# Patient Record
Sex: Male | Born: 1980 | Race: White | Hispanic: No | Smoking: Current every day smoker
Health system: Southern US, Community
[De-identification: ages and names within clinical notes are randomized; demographics above are authoritative.]

## PROBLEM LIST (undated history)

## (undated) DIAGNOSIS — M109 Gout, unspecified: Secondary | ICD-10-CM

## (undated) DIAGNOSIS — I10 Essential (primary) hypertension: Secondary | ICD-10-CM

## (undated) HISTORY — PX: HERNIA REPAIR: SHX51

---

## 2004-05-08 ENCOUNTER — Ambulatory Visit (HOSPITAL_COMMUNITY): Admission: RE | Admit: 2004-05-08 | Discharge: 2004-05-08 | Payer: Self-pay | Admitting: Family Medicine

## 2006-07-20 ENCOUNTER — Emergency Department (HOSPITAL_COMMUNITY): Admission: EM | Admit: 2006-07-20 | Discharge: 2006-07-20 | Payer: Self-pay | Admitting: Emergency Medicine

## 2006-08-22 ENCOUNTER — Ambulatory Visit: Payer: Self-pay | Admitting: Orthopedic Surgery

## 2006-08-23 ENCOUNTER — Ambulatory Visit: Payer: Self-pay | Admitting: Family Medicine

## 2006-08-23 LAB — CONVERTED CEMR LAB
AST: 25 units/L (ref 0–37)
Albumin: 5 g/dL (ref 3.5–5.2)
Alkaline Phosphatase: 62 units/L (ref 39–117)
BUN: 12 mg/dL (ref 6–23)
Basophils Relative: 0 % (ref 0–1)
Creatinine, Ser: 1.06 mg/dL (ref 0.40–1.50)
Eosinophils Relative: 1 % (ref 0–5)
Glucose, Bld: 88 mg/dL (ref 70–99)
HCT: 47.1 % (ref 39.0–52.0)
Lymphocytes Relative: 39 % (ref 12–46)
Lymphs Abs: 3 10*3/uL (ref 0.7–3.3)
Monocytes Absolute: 0.6 10*3/uL (ref 0.2–0.7)
Monocytes Relative: 8 % (ref 3–11)
Neutrophils Relative %: 51 % (ref 43–77)
Platelets: 263 10*3/uL (ref 150–400)
Potassium: 4.3 meq/L (ref 3.5–5.3)
WBC: 7.6 10*3/uL (ref 4.0–10.5)

## 2006-08-26 ENCOUNTER — Encounter: Payer: Self-pay | Admitting: Family Medicine

## 2006-08-26 DIAGNOSIS — F411 Generalized anxiety disorder: Secondary | ICD-10-CM | POA: Insufficient documentation

## 2006-08-26 DIAGNOSIS — L259 Unspecified contact dermatitis, unspecified cause: Secondary | ICD-10-CM | POA: Insufficient documentation

## 2006-08-26 DIAGNOSIS — F17200 Nicotine dependence, unspecified, uncomplicated: Secondary | ICD-10-CM

## 2006-08-26 DIAGNOSIS — F329 Major depressive disorder, single episode, unspecified: Secondary | ICD-10-CM

## 2006-09-05 ENCOUNTER — Ambulatory Visit: Payer: Self-pay | Admitting: Orthopedic Surgery

## 2006-09-07 ENCOUNTER — Ambulatory Visit: Payer: Self-pay | Admitting: Family Medicine

## 2006-10-06 ENCOUNTER — Ambulatory Visit: Payer: Self-pay | Admitting: Family Medicine

## 2006-10-06 DIAGNOSIS — S6000XA Contusion of unspecified finger without damage to nail, initial encounter: Secondary | ICD-10-CM

## 2006-10-06 DIAGNOSIS — M79609 Pain in unspecified limb: Secondary | ICD-10-CM

## 2006-10-10 ENCOUNTER — Encounter (INDEPENDENT_AMBULATORY_CARE_PROVIDER_SITE_OTHER): Payer: Self-pay | Admitting: Family Medicine

## 2006-10-20 ENCOUNTER — Encounter: Payer: Self-pay | Admitting: Orthopedic Surgery

## 2006-10-26 ENCOUNTER — Encounter (INDEPENDENT_AMBULATORY_CARE_PROVIDER_SITE_OTHER): Payer: Self-pay | Admitting: Family Medicine

## 2006-11-07 ENCOUNTER — Ambulatory Visit: Payer: Self-pay | Admitting: Orthopedic Surgery

## 2006-11-11 ENCOUNTER — Encounter (INDEPENDENT_AMBULATORY_CARE_PROVIDER_SITE_OTHER): Payer: Self-pay | Admitting: Family Medicine

## 2006-11-17 ENCOUNTER — Ambulatory Visit: Payer: Self-pay | Admitting: Family Medicine

## 2006-11-17 DIAGNOSIS — M542 Cervicalgia: Secondary | ICD-10-CM | POA: Insufficient documentation

## 2006-11-18 ENCOUNTER — Telehealth (INDEPENDENT_AMBULATORY_CARE_PROVIDER_SITE_OTHER): Payer: Self-pay | Admitting: Family Medicine

## 2006-11-30 ENCOUNTER — Ambulatory Visit (HOSPITAL_COMMUNITY): Admission: RE | Admit: 2006-11-30 | Discharge: 2006-11-30 | Payer: Self-pay | Admitting: Family Medicine

## 2006-12-05 ENCOUNTER — Telehealth (INDEPENDENT_AMBULATORY_CARE_PROVIDER_SITE_OTHER): Payer: Self-pay | Admitting: Family Medicine

## 2007-01-03 ENCOUNTER — Telehealth (INDEPENDENT_AMBULATORY_CARE_PROVIDER_SITE_OTHER): Payer: Self-pay | Admitting: Family Medicine

## 2007-01-05 ENCOUNTER — Encounter (INDEPENDENT_AMBULATORY_CARE_PROVIDER_SITE_OTHER): Payer: Self-pay | Admitting: Family Medicine

## 2007-01-17 ENCOUNTER — Telehealth (INDEPENDENT_AMBULATORY_CARE_PROVIDER_SITE_OTHER): Payer: Self-pay | Admitting: Family Medicine

## 2007-01-17 ENCOUNTER — Ambulatory Visit: Payer: Self-pay | Admitting: Family Medicine

## 2007-01-17 DIAGNOSIS — K5289 Other specified noninfective gastroenteritis and colitis: Secondary | ICD-10-CM

## 2008-08-18 IMAGING — CR DG FOOT COMPLETE 3+V*L*
3 series · 3 of 3 positions shown · non-contrast
Comparison: none

HISTORY: Left foot pain

LEFT FOOT 3 VIEWS:
No fracture, dislocation, or bone destruction.
Joint spaces preserved.  
Mineralization normal.

[view not recorded (1 of 3)]
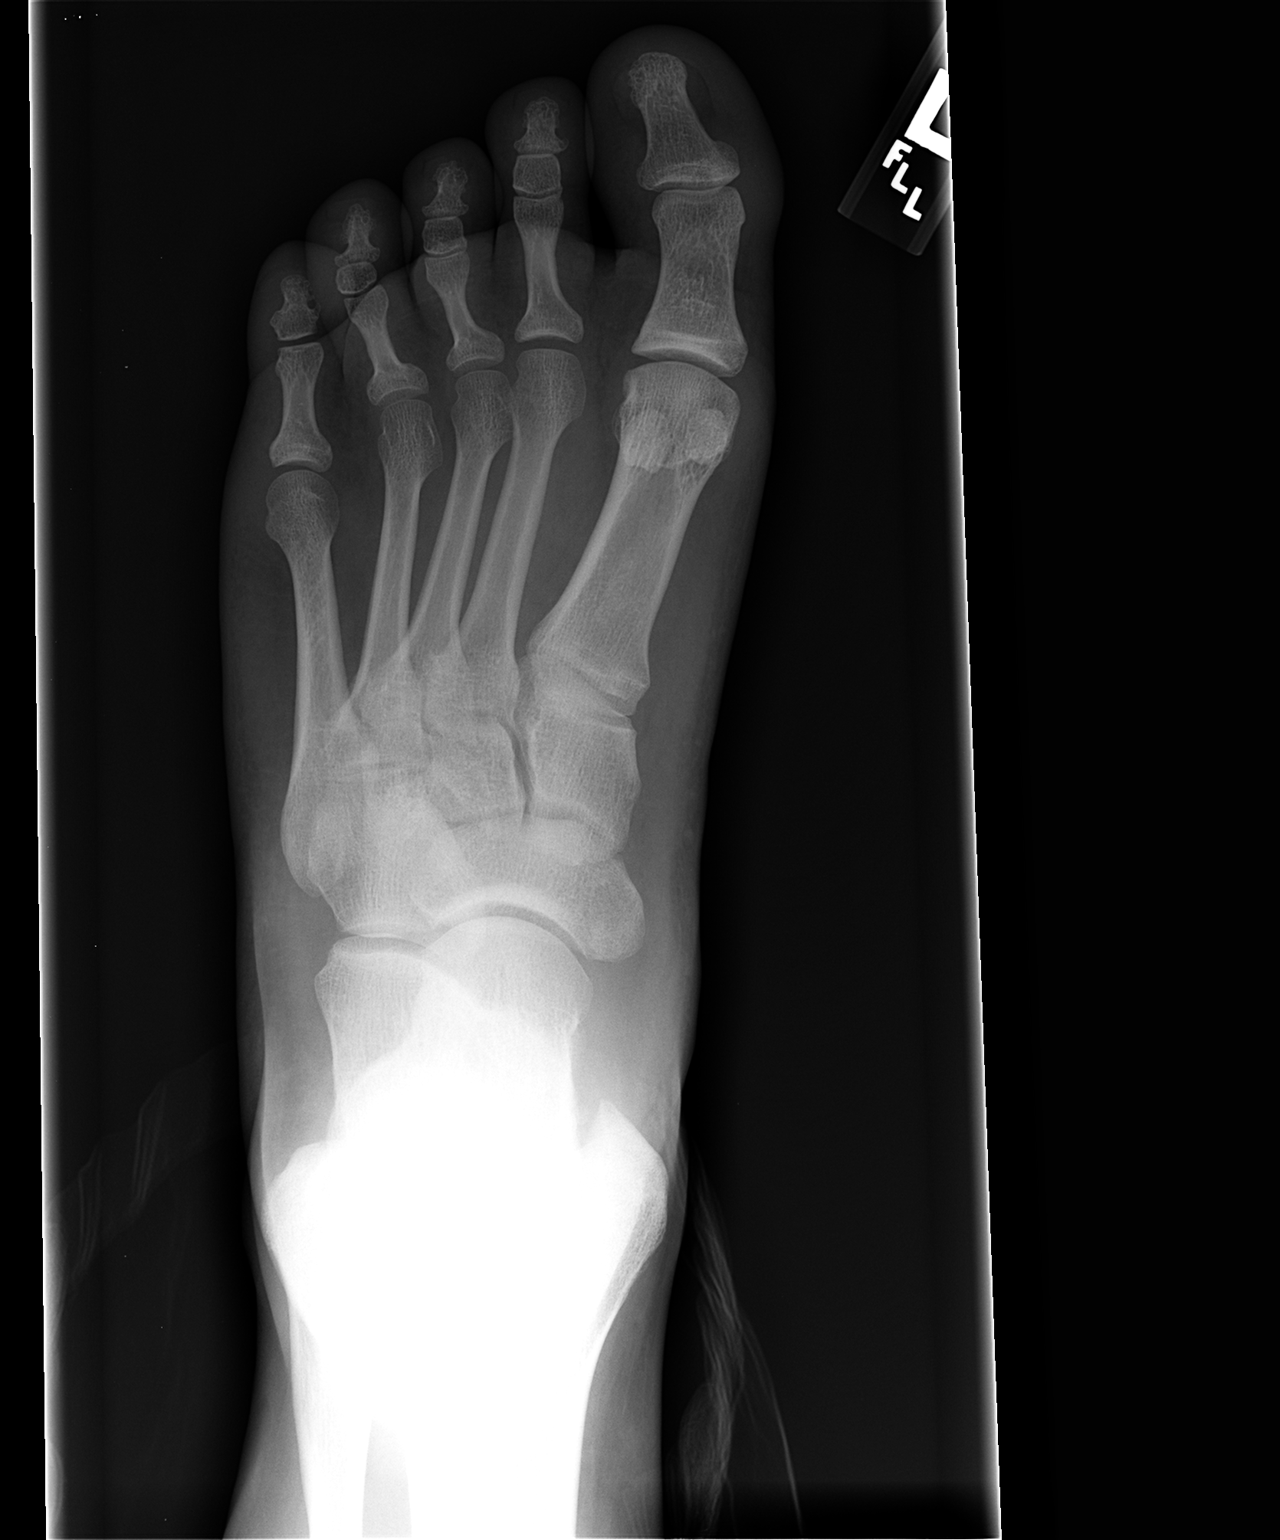

[view not recorded (2 of 3)]
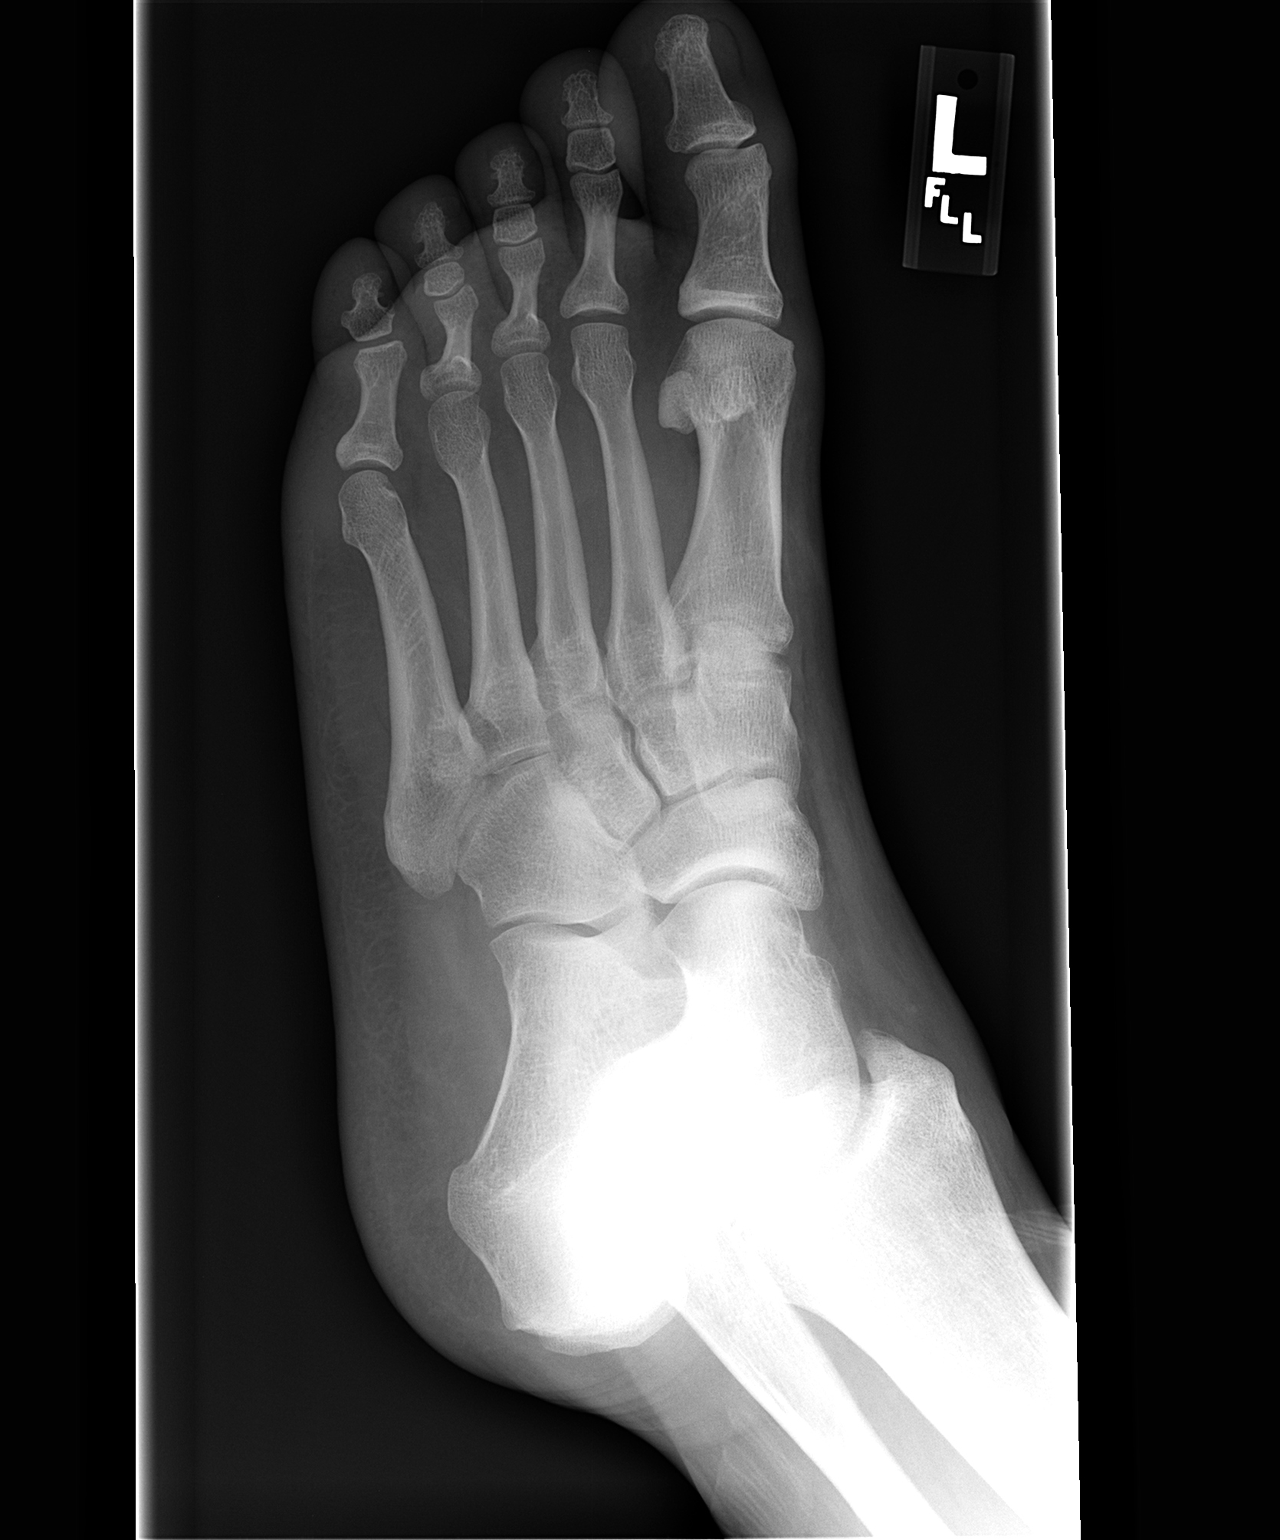

[view not recorded (3 of 3)]
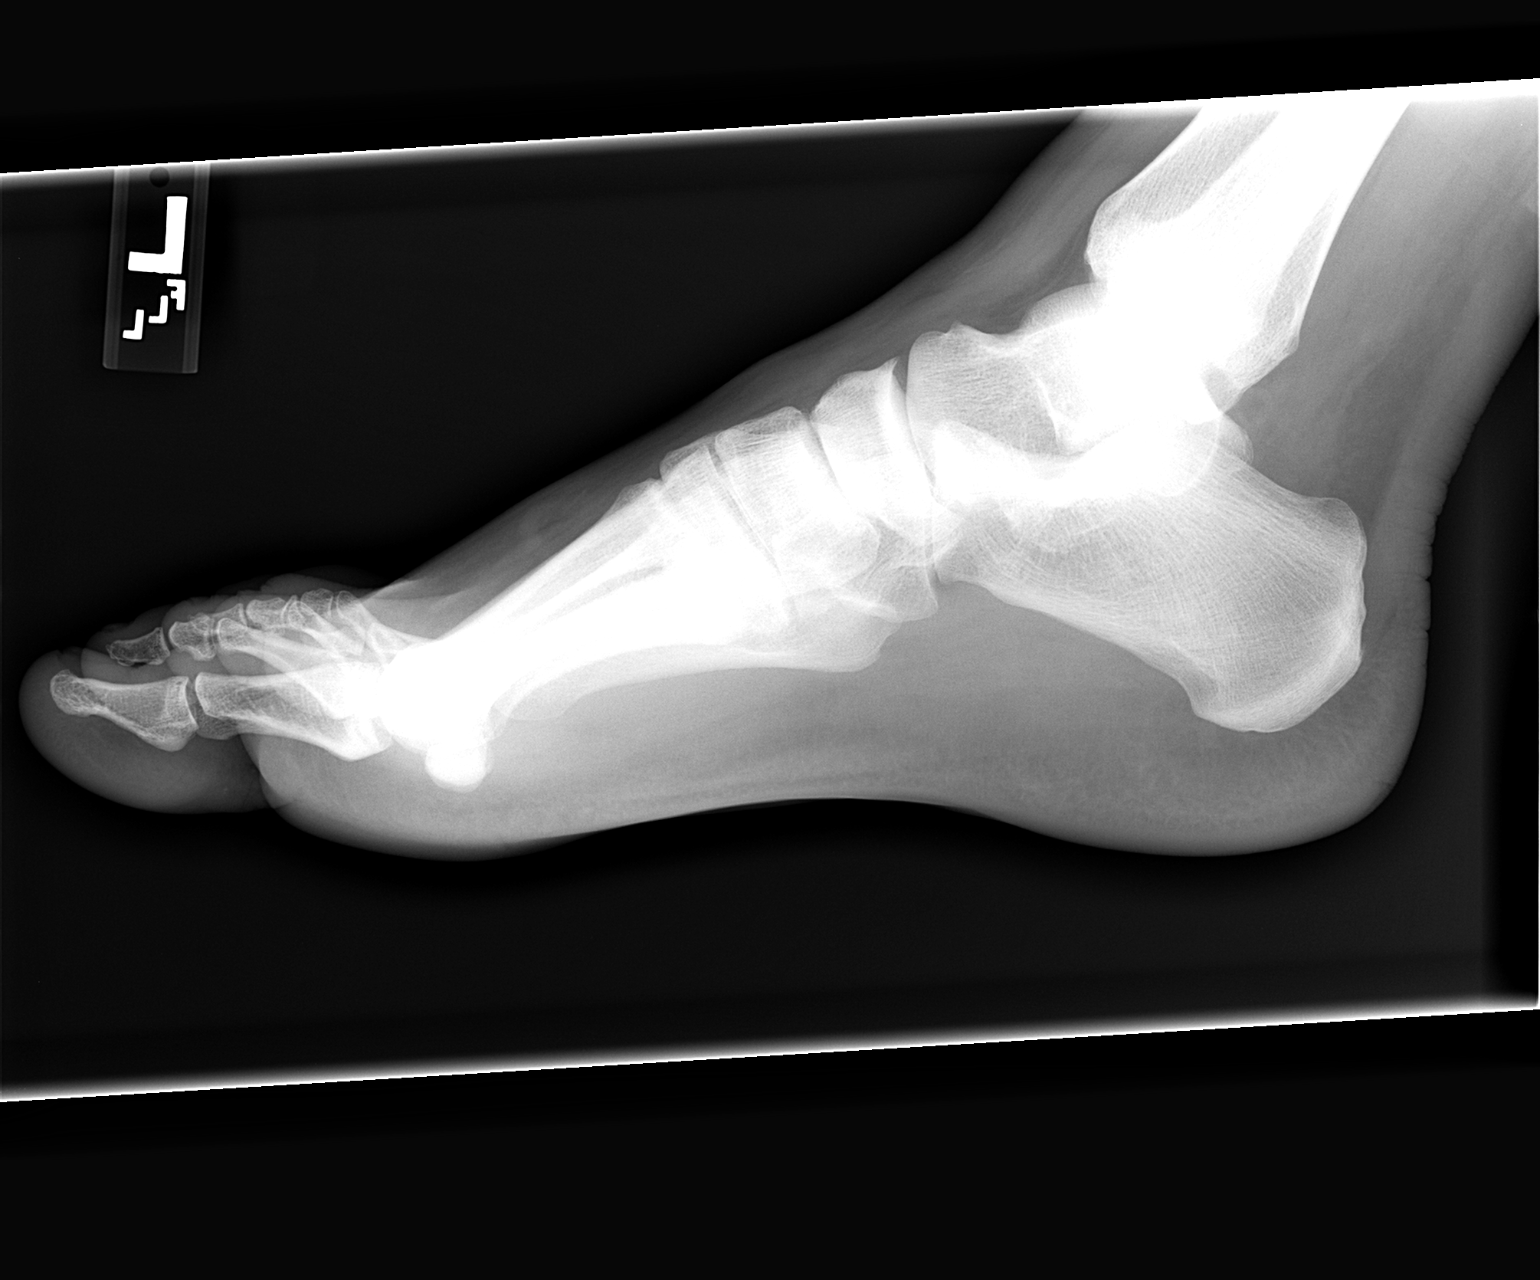

[3 of 3 positions shown; findings below may reference images not displayed]

IMPRESSION: No acute abnormalities.

## 2009-11-13 ENCOUNTER — Encounter: Payer: Self-pay | Admitting: Orthopedic Surgery

## 2010-09-08 NOTE — Letter (Signed)
Summary: *Orthopedic No Show Letter  [facility]  [address]  [place], [place] [address]   Phone: [phone]  Fax: [phone]      11/13/2009    Chase Torres [address] [place], [place]  [address]       Dear [name],   Our records indicate that you missed your scheduled appointment with Dr. Beaulah Corin on 11/10/09.  Please contact this office to reschedule your appointment as soon as possible.  It is important that you keep your scheduled appointments with your physician, so we can provide you the best care possible.       Sincerely,    Dr. Terrance Mass, MD [facility] Phone [phone]

## 2014-05-16 ENCOUNTER — Emergency Department (HOSPITAL_COMMUNITY)
Admission: EM | Admit: 2014-05-16 | Discharge: 2014-05-16 | Disposition: A | Discharge status: Home / Self Care | Payer: 59 | Attending: Emergency Medicine | Admitting: Emergency Medicine

## 2014-05-16 ENCOUNTER — Emergency Department (HOSPITAL_COMMUNITY): Payer: 59

## 2014-05-16 ENCOUNTER — Encounter (HOSPITAL_COMMUNITY): Payer: Self-pay | Admitting: Emergency Medicine

## 2014-05-16 DIAGNOSIS — N200 Calculus of kidney: Secondary | ICD-10-CM | POA: Insufficient documentation

## 2014-05-16 DIAGNOSIS — M549 Dorsalgia, unspecified: Secondary | ICD-10-CM | POA: Diagnosis not present

## 2014-05-16 DIAGNOSIS — R61 Generalized hyperhidrosis: Secondary | ICD-10-CM | POA: Diagnosis not present

## 2014-05-16 DIAGNOSIS — Z880 Allergy status to penicillin: Secondary | ICD-10-CM | POA: Diagnosis not present

## 2014-05-16 DIAGNOSIS — Z79899 Other long term (current) drug therapy: Secondary | ICD-10-CM | POA: Diagnosis not present

## 2014-05-16 DIAGNOSIS — Z720 Tobacco use: Secondary | ICD-10-CM | POA: Diagnosis not present

## 2014-05-16 DIAGNOSIS — R109 Unspecified abdominal pain: Secondary | ICD-10-CM

## 2014-05-16 LAB — CBC WITH DIFFERENTIAL/PLATELET
BASOS PCT: 0 % (ref 0–1)
Basophils Absolute: 0 10*3/uL (ref 0.0–0.1)
EOS ABS: 0 10*3/uL (ref 0.0–0.7)
EOS PCT: 0 % (ref 0–5)
HEMATOCRIT: 43.2 % (ref 39.0–52.0)
HEMOGLOBIN: 15.5 g/dL (ref 13.0–17.0)
LYMPHS PCT: 10 % — AB (ref 12–46)
Lymphs Abs: 1.6 10*3/uL (ref 0.7–4.0)
MCH: 32.8 pg (ref 26.0–34.0)
MCHC: 35.9 g/dL (ref 30.0–36.0)
MCV: 91.5 fL (ref 78.0–100.0)
MONO ABS: 1 10*3/uL (ref 0.1–1.0)
Monocytes Relative: 7 % (ref 3–12)
NEUTROS ABS: 12.5 10*3/uL — AB (ref 1.7–7.7)
NEUTROS PCT: 83 % — AB (ref 43–77)
Platelets: 218 10*3/uL (ref 150–400)
RBC: 4.72 MIL/uL (ref 4.22–5.81)
RDW: 12.8 % (ref 11.5–15.5)
WBC: 15.2 10*3/uL — AB (ref 4.0–10.5)

## 2014-05-16 LAB — URINALYSIS, ROUTINE W REFLEX MICROSCOPIC
BILIRUBIN URINE: NEGATIVE
Glucose, UA: 100 mg/dL — AB
Leukocytes, UA: NEGATIVE
Nitrite: NEGATIVE
PROTEIN: 30 mg/dL — AB
Specific Gravity, Urine: 1.01 (ref 1.005–1.030)
Urobilinogen, UA: 0.2 mg/dL (ref 0.0–1.0)
pH: 7 (ref 5.0–8.0)

## 2014-05-16 LAB — COMPREHENSIVE METABOLIC PANEL
ALK PHOS: 65 U/L (ref 39–117)
ALT: 24 U/L (ref 0–53)
AST: 23 U/L (ref 0–37)
Albumin: 4.7 g/dL (ref 3.5–5.2)
Anion gap: 17 — ABNORMAL HIGH (ref 5–15)
BILIRUBIN TOTAL: 0.7 mg/dL (ref 0.3–1.2)
BUN: 20 mg/dL (ref 6–23)
CO2: 21 meq/L (ref 19–32)
CREATININE: 1.23 mg/dL (ref 0.50–1.35)
Calcium: 9.9 mg/dL (ref 8.4–10.5)
Chloride: 101 mEq/L (ref 96–112)
GFR calc Af Amer: 87 mL/min — ABNORMAL LOW (ref >90)
GFR calc non Af Amer: 75 mL/min — ABNORMAL LOW (ref >90)
GLUCOSE: 92 mg/dL (ref 70–99)
Potassium: 3.7 mEq/L (ref 3.7–5.3)
SODIUM: 139 meq/L (ref 137–147)
Total Protein: 7.9 g/dL (ref 6.0–8.3)

## 2014-05-16 LAB — URINE MICROSCOPIC-ADD ON

## 2014-05-16 MED ORDER — SODIUM CHLORIDE 0.9 % IV SOLN: INTRAVENOUS | Status: DC

## 2014-05-16 MED ORDER — NAPROXEN 500 MG PO TABS: 500.0000 mg | ORAL_TABLET | Freq: Two times a day (BID) | ORAL | Status: DC

## 2014-05-16 MED ORDER — ONDANSETRON HCL 4 MG/2ML IJ SOLN
4.0000 mg | Freq: Once | INTRAMUSCULAR | Status: AC
  Administered 2014-05-16: 4 mg via INTRAVENOUS
  Filled 2014-05-16: qty 2

## 2014-05-16 MED ORDER — KETOROLAC TROMETHAMINE 30 MG/ML IJ SOLN
30.0000 mg | Freq: Once | INTRAMUSCULAR | Status: AC
  Administered 2014-05-16: 30 mg via INTRAVENOUS
  Filled 2014-05-16: qty 1

## 2014-05-16 MED ORDER — SODIUM CHLORIDE 0.9 % IV BOLUS (SEPSIS)
1000.0000 mL | Freq: Once | INTRAVENOUS | Status: AC
  Administered 2014-05-16: 1000 mL via INTRAVENOUS

## 2014-05-16 NOTE — ED Provider Notes (Signed)
CSN: [id number]     Arrival date & time 05/16/14  1359 History   First MD Initiated Contact with Patient 05/16/14 1633     No chief complaint on file.    (Consider location/radiation/quality/duration/timing/severity/associated sxs/prior Treatment) The history is provided by the patient.   33 year old male with acute onset of left back left flank and left side abdomen abdominal pain was 10 out of 10 now is currently 1/10. Pain was described as sharp. Associated with a feeling of needing to PE frequently. Difficulty getting flow started. Associated with diaphoresis no nausea no vomiting never had anything like this before family history of kidney stones with brother and mother. Pain is described as sharp.   History reviewed. No pertinent past medical history. Past Surgical History  Procedure Laterality Date  . Hernia repair     History reviewed. No pertinent family history. History  Substance Use Topics  . Smoking status: Current Every Day Smoker  . Smokeless tobacco: Not on file  . Alcohol Use: No    Review of Systems  Constitutional: Positive for diaphoresis. Negative for fever.  HENT: Negative for congestion.   Eyes: Negative for redness.  Respiratory: Negative for shortness of breath.   Cardiovascular: Negative for chest pain.  Gastrointestinal: Positive for abdominal pain. Negative for nausea and vomiting.  Genitourinary: Positive for urgency, flank pain and difficulty urinating.  Musculoskeletal: Positive for back pain.  Skin: Negative for rash.  Neurological: Negative for headaches.  Hematological: Does not bruise/bleed easily.  Psychiatric/Behavioral: Negative for confusion.      Allergies  Penicillins  Home Medications   Prior to Admission medications   Medication Sig Start Date End Date Taking? Authorizing Provider  allopurinol (ZYLOPRIM) 100 MG tablet Take 100 mg by mouth at bedtime.   Yes Historical Provider, MD  ALPRAZolam Prudy Feeler) 1 MG tablet Take 1 mg by  mouth at bedtime as needed for anxiety.   Yes Historical Provider, MD  HYDROcodone-acetaminophen (NORCO/VICODIN) 5-325 MG per tablet Take 1 tablet by mouth at bedtime as needed for moderate pain.   Yes Historical Provider, MD  sertraline (ZOLOFT) 100 MG tablet Take 100 mg by mouth at bedtime.   Yes Historical Provider, MD  naproxen (NAPROSYN) 500 MG tablet Take 1 tablet (500 mg total) by mouth 2 (two) times daily. 05/16/14   Vanetta Mulders, MD   BP 122/80  Pulse 56  Temp(Src) 98.7 F (37.1 C) (Oral)  Resp 18  SpO2 96% Physical Exam  Nursing note and vitals reviewed. Constitutional: He is oriented to person, place, and time. He appears well-developed and well-nourished. No distress.  HENT:  Head: Normocephalic and atraumatic.  Mouth/Throat: Oropharynx is clear and moist.  Eyes: Conjunctivae and EOM are normal. Pupils are equal, round, and reactive to light.  Neck: Normal range of motion.  Cardiovascular: Normal rate, regular rhythm and normal heart sounds.   No murmur heard. Pulmonary/Chest: Effort normal and breath sounds normal. No respiratory distress.  Abdominal: Bowel sounds are normal. There is no tenderness.  Neurological: He is alert and oriented to person, place, and time. No cranial nerve deficit. He exhibits normal muscle tone. Coordination normal.  Skin: Skin is warm. No rash noted.    ED Course  Procedures (including critical care time) Labs Review Labs Reviewed  URINALYSIS, ROUTINE W REFLEX MICROSCOPIC - Abnormal; Notable for the following:    Glucose, UA 100 (*)    Hgb urine dipstick LARGE (*)    Ketones, ur TRACE (*)    Protein, ur 30 (*)  All other components within normal limits  CBC WITH DIFFERENTIAL - Abnormal; Notable for the following:    WBC 15.2 (*)    Neutrophils Relative % 83 (*)    Neutro Abs 12.5 (*)    Lymphocytes Relative 10 (*)    All other components within normal limits  COMPREHENSIVE METABOLIC PANEL - Abnormal; Notable for the following:     GFR calc non Af Amer 75 (*)    GFR calc Af Amer 87 (*)    Anion gap 17 (*)    All other components within normal limits  URINE MICROSCOPIC-ADD ON - Abnormal; Notable for the following:    Bacteria, UA FEW (*)    All other components within normal limits   Results for orders placed during the hospital encounter of 05/16/14  URINALYSIS, ROUTINE W REFLEX MICROSCOPIC      Result Value Ref Range   Color, Urine YELLOW  YELLOW   APPearance CLEAR  CLEAR   Specific Gravity, Urine 1.010  1.005 - 1.030   pH 7.0  5.0 - 8.0   Glucose, UA 100 (*) NEGATIVE mg/dL   Hgb urine dipstick LARGE (*) NEGATIVE   Bilirubin Urine NEGATIVE  NEGATIVE   Ketones, ur TRACE (*) NEGATIVE mg/dL   Protein, ur 30 (*) NEGATIVE mg/dL   Urobilinogen, UA 0.2  0.0 - 1.0 mg/dL   Nitrite NEGATIVE  NEGATIVE   Leukocytes, UA NEGATIVE  NEGATIVE  CBC WITH DIFFERENTIAL      Result Value Ref Range   WBC 15.2 (*) 4.0 - 10.5 K/uL   RBC 4.72  4.22 - 5.81 MIL/uL   Hemoglobin 15.5  13.0 - 17.0 g/dL   HCT 16.143.2  09.639.0 - 04.552.0 %   MCV 91.5  78.0 - 100.0 fL   MCH 32.8  26.0 - 34.0 pg   MCHC 35.9  30.0 - 36.0 g/dL   RDW 40.912.8  81.111.5 - 91.415.5 %   Platelets 218  150 - 400 K/uL   Neutrophils Relative % 83 (*) 43 - 77 %   Neutro Abs 12.5 (*) 1.7 - 7.7 K/uL   Lymphocytes Relative 10 (*) 12 - 46 %   Lymphs Abs 1.6  0.7 - 4.0 K/uL   Monocytes Relative 7  3 - 12 %   Monocytes Absolute 1.0  0.1 - 1.0 K/uL   Eosinophils Relative 0  0 - 5 %   Eosinophils Absolute 0.0  0.0 - 0.7 K/uL   Basophils Relative 0  0 - 1 %   Basophils Absolute 0.0  0.0 - 0.1 K/uL  COMPREHENSIVE METABOLIC PANEL      Result Value Ref Range   Sodium 139  137 - 147 mEq/L   Potassium 3.7  3.7 - 5.3 mEq/L   Chloride 101  96 - 112 mEq/L   CO2 21  19 - 32 mEq/L   Glucose, Bld 92  70 - 99 mg/dL   BUN 20  6 - 23 mg/dL   Creatinine, Ser 7.821.23  0.50 - 1.35 mg/dL   Calcium 9.9  8.4 - 95.610.5 mg/dL   Total Protein 7.9  6.0 - 8.3 g/dL   Albumin 4.7  3.5 - 5.2 g/dL   AST 23  0  - 37 U/L   ALT 24  0 - 53 U/L   Alkaline Phosphatase 65  39 - 117 U/L   Total Bilirubin 0.7  0.3 - 1.2 mg/dL   GFR calc non Af Amer 75 (*) >90 mL/min   GFR calc  Af Amer 87 (*) >90 mL/min   Anion gap 17 (*) 5 - 15  URINE MICROSCOPIC-ADD ON      Result Value Ref Range   Squamous Epithelial / LPF RARE  RARE   WBC, UA 0-2  <3 WBC/hpf   RBC / HPF 21-50  <3 RBC/hpf   Bacteria, UA FEW (*) RARE     Imaging Review Ct Renal Stone Study  05/16/2014   CLINICAL DATA:  Left flank pain times 2 days.  Initial evaluation.  EXAM: CT ABDOMEN AND PELVIS WITHOUT CONTRAST  TECHNIQUE: Multidetector CT imaging of the abdomen and pelvis was performed following the standard protocol without IV contrast.  COMPARISON:  None.  FINDINGS: Liver normal. Spleen normal. Splenosis. Pancreas normal. Gallbladder nondistended. No biliary distention.  Adrenals normal. Punctate nonobstructing 2 mm stone right renal collecting system. Right ureter unremarkable. Mild left renal caliectasis and perirenal fat stranding. Mild left renal edema cannot be excluded. No definite obstructing ureteral stone noted. Recently passed on the left could present in this fashion. Mild changes of pyelonephritis on the left cannot be excluded.  Bladder is nondistended. Mild bladder wall thickening is noted, this may be from lack of distention. Active bladder pathology including cystitis cannot be excluded. Prostate normal in size.  No significant adenopathy.  Abdominal aorta normal in caliber.  Appendix is normal.  No evidence of bowel obstruction or free air.  Lung bases are clear. Heart size normal. Degenerative changes thoracic and lumbar spine.  IMPRESSION: 1. Mild left renal caliectasis and tear renal streaking. Mild left renal edema cannot be excluded. These findings may be secondary to recently passed stone. No obstructing stones identified. Process such as pyelonephritis cannot be excluded. 2. Mild bladder wall thickening. This may be from  nondistended state. Process such as cystitis cannot be excluded.   Electronically Signed   By: Maisie Fus  Register   On: 05/16/2014 17:13     EKG Interpretation None      MDM   Final diagnoses:  Kidney stone on left side   CT scan consistent with recent passage of a left-sided kidney stone which fits the hematuria in the urine and the patient's left flank pain. Also fits the fact that pain currently is as 1/10. Patient most likely passed the stone. No history of kidney stones in the past. Patient will be treated with anti-inflammatories Naprosyn or Motrin at home. Patient does not need a work note. Renal function was some mild elevation in the creatinine but no significant renal insufficiency.   Vanetta Mulders, MD 05/16/14 1742

## 2014-05-16 NOTE — ED Notes (Signed)
Pain rt flank since yesterday, No N/v,  No fever. Alert,

## 2014-05-16 NOTE — Discharge Instructions (Signed)
As we discussed CT scan suggestive of passage of a left sided ureteral kidney stone. Most of the pain should be gone now. Recommend taking Motrin or Naprosyn. Prescription provided for Naprosyn but she can use over-the-counter Motrin. Return for any newer worse symptoms.

## 2015-09-23 MED FILL — HYDROCODON-APAP 5-325: 5-325 | 30 days supply | Qty: 30 | Fill #0

## 2015-09-23 MED FILL — ALLOPURINOL 300 MG TABLET: 300 | 30 days supply | Qty: 30 | Fill #0

## 2015-09-23 MED FILL — SERTRALINE HCL 100 MG TAB: 100 | 30 days supply | Qty: 45 | Fill #0

## 2015-09-26 MED FILL — ALPRAZolam 1 MG TABS: 1 | 30 days supply | Qty: 30 | Fill #0

## 2015-10-28 MED FILL — ALPRAZolam 1 MG TABS: 1 | 30 days supply | Qty: 30 | Fill #0

## 2015-10-28 MED FILL — SERTRALINE HCL 100 MG TAB: 100 | 30 days supply | Qty: 45 | Fill #1

## 2015-10-28 MED FILL — HYDROCODON-APAP 5-325: 5-325 | 30 days supply | Qty: 30 | Fill #0

## 2015-10-28 MED FILL — DICLOFENAC SOD EC 75 MG TAB: 75 | 90 days supply | Qty: 180 | Fill #1

## 2015-10-28 MED FILL — ALLOPURINOL 300 MG TABLET: 300 | 90 days supply | Qty: 90 | Fill #1

## 2015-12-03 MED FILL — SERTRALINE HCL 100 MG TAB: 100 | 30 days supply | Qty: 45 | Fill #2

## 2015-12-03 MED FILL — HYDROCODON-APAP 5-325: 5-325 | 30 days supply | Qty: 30 | Fill #0

## 2015-12-03 MED FILL — ALPRAZolam 1 MG TABS: 1 | 30 days supply | Qty: 30 | Fill #0

## 2016-01-09 MED FILL — SERTRALINE HCL 100 MG TAB: 100 | 60 days supply | Qty: 90 | Fill #0

## 2016-01-09 MED FILL — ALPRAZolam 1 MG TABS: 1 | 30 days supply | Qty: 30 | Fill #0

## 2016-01-09 MED FILL — HYDROCODON-APAP 5-325: 5-325 | 30 days supply | Qty: 30 | Fill #0

## 2016-03-30 MED FILL — ALPRAZolam 1 MG TABS: 1 | 30 days supply | Qty: 30 | Fill #0

## 2016-03-30 MED FILL — SERTRALINE HCL 100 MG TAB: 100 | 60 days supply | Qty: 90 | Fill #0

## 2016-03-30 MED FILL — ALLOPURINOL 300 MG TABLET: 300 | 90 days supply | Qty: 90 | Fill #0

## 2016-03-30 MED FILL — HYDROCODON-APAP 5-325: 5-325 | 30 days supply | Qty: 30 | Fill #0

## 2016-05-26 MED FILL — ALPRAZolam 1 MG TABS: 1 | 30 days supply | Qty: 30 | Fill #0

## 2016-05-26 MED FILL — HYDROCODON-APAP 5-325: 5-325 | 30 days supply | Qty: 30 | Fill #0

## 2016-07-08 MED FILL — ALLOPURINOL 300 MG TABLET: 300 | 90 days supply | Qty: 90 | Fill #1

## 2016-07-08 MED FILL — DICLOFENAC SOD 75 MG TAB EC: 75 | 30 days supply | Qty: 60 | Fill #0

## 2016-07-08 MED FILL — SERTRALINE HCL 100 MG TAB: 100 | 60 days supply | Qty: 90 | Fill #1

## 2016-07-08 MED FILL — ALPRAZolam 1 MG TABS: 1 | 30 days supply | Qty: 30 | Fill #0

## 2016-07-12 MED FILL — HYDROCODON-APAP 5-325: 5-325 | 30 days supply | Qty: 30 | Fill #0

## 2016-08-13 MED FILL — ALPRAZolam 1 MG TABS: 1 | 30 days supply | Qty: 30 | Fill #0

## 2016-08-13 MED FILL — HYDROCODON-APAP 5-325: 5-325 | 30 days supply | Qty: 30 | Fill #0

## 2016-09-28 MED FILL — SERTRALINE HCL 100 MG TAB: 100 | 60 days supply | Qty: 90 | Fill #2

## 2016-09-28 MED FILL — DICLOFENAC SOD 75 MG TAB EC: 75 | 30 days supply | Qty: 60 | Fill #1

## 2016-09-28 MED FILL — HYDROCODON-APAP 5-325: 5-325 | 30 days supply | Qty: 30 | Fill #0

## 2016-09-28 MED FILL — ALPRAZolam 1 MG TABS: 1 | 30 days supply | Qty: 30 | Fill #0

## 2016-11-15 MED FILL — ALPRAZolam 1 MG TABS: 1 | 30 days supply | Qty: 30 | Fill #0

## 2016-11-15 MED FILL — HYDROCODON-APAP 5-325: 5-325 | 30 days supply | Qty: 30 | Fill #0

## 2016-11-15 MED FILL — ALLOPURINOL 300 MG TABLET: 300 | 90 days supply | Qty: 90 | Fill #2

## 2016-11-15 MED FILL — DICLOFENAC SOD 75 MG TAB EC: 75 | 30 days supply | Qty: 60 | Fill #2

## 2017-02-16 MED FILL — ALPRAZolam 1 MG TABS: 1 | 30 days supply | Qty: 30 | Fill #0

## 2017-02-16 MED FILL — SERTRALINE HCL 100 MG TAB: 100 | 60 days supply | Qty: 90 | Fill #0

## 2017-03-29 MED FILL — ALPRAZolam 1 MG TABS: 1 | 30 days supply | Qty: 30 | Fill #0

## 2017-03-29 MED FILL — ALLOPURINOL 300 MG TABLET: 300 | 90 days supply | Qty: 90 | Fill #0

## 2017-03-30 MED FILL — HYDROCODON-APAP 5-325: 5-325 | 30 days supply | Qty: 30 | Fill #0

## 2017-05-11 MED FILL — SERTRALINE HCL 100 MG TAB: 100 | 60 days supply | Qty: 90 | Fill #0

## 2017-05-11 MED FILL — ALPRAZolam 1 MG TABS: 1 | 30 days supply | Qty: 30 | Fill #0

## 2017-05-11 MED FILL — HYDROCODON-APAP 5-325: 5-325 | 30 days supply | Qty: 30 | Fill #0

## 2019-07-10 ENCOUNTER — Other Ambulatory Visit: Payer: Self-pay | Admitting: *Deleted

## 2019-07-10 DIAGNOSIS — Z20822 Contact with and (suspected) exposure to covid-19: Secondary | ICD-10-CM

## 2019-07-13 ENCOUNTER — Telehealth: Payer: Self-pay | Admitting: *Deleted

## 2019-07-13 LAB — NOVEL CORONAVIRUS, NAA: SARS-CoV-2, NAA: NOT DETECTED

## 2019-07-13 NOTE — Telephone Encounter (Signed)
Pt given result of COVID test from 07/10/2019; he verbalized understanding.

## 2020-10-16 ENCOUNTER — Telehealth: Payer: Self-pay

## 2020-10-16 NOTE — Telephone Encounter (Signed)
Client called in stating he now has all documents needed to enroll in [facility]. He was sent checklist of needed docs by Abran Duke. He is established with [facility] Appointment to enroll given for 10/16/20 at 10 am.   Francee Nodal RN Clara Gunn/[facility]

## 2020-10-28 NOTE — Congregational Nurse Program (Signed)
Care coordination note update for [employer] eligible until 10/07/21. Emailed secure compliant to [facility] client's primary care provider.   Francee Nodal RN [employer]

## 2021-07-14 ENCOUNTER — Emergency Department (HOSPITAL_COMMUNITY)
Admission: EM | Admit: 2021-07-14 | Discharge: 2021-07-14 | Disposition: A | Discharge status: Home / Self Care | Payer: 59 | Attending: Emergency Medicine | Admitting: Emergency Medicine

## 2021-07-14 ENCOUNTER — Other Ambulatory Visit: Payer: Self-pay

## 2021-07-14 ENCOUNTER — Encounter (HOSPITAL_COMMUNITY): Payer: Self-pay

## 2021-07-14 DIAGNOSIS — F17200 Nicotine dependence, unspecified, uncomplicated: Secondary | ICD-10-CM | POA: Insufficient documentation

## 2021-07-14 DIAGNOSIS — K625 Hemorrhage of anus and rectum: Secondary | ICD-10-CM | POA: Insufficient documentation

## 2021-07-14 DIAGNOSIS — R001 Bradycardia, unspecified: Secondary | ICD-10-CM | POA: Insufficient documentation

## 2021-07-14 DIAGNOSIS — I10 Essential (primary) hypertension: Secondary | ICD-10-CM

## 2021-07-14 HISTORY — DX: Gout, unspecified: M10.9

## 2021-07-14 HISTORY — DX: Essential (primary) hypertension: I10

## 2021-07-14 LAB — COMPREHENSIVE METABOLIC PANEL
ALT: 20 U/L (ref 0–44)
AST: 18 U/L (ref 15–41)
Albumin: 4.3 g/dL (ref 3.5–5.0)
Alkaline Phosphatase: 62 U/L (ref 38–126)
Anion gap: 8 (ref 5–15)
BUN: 13 mg/dL (ref 6–20)
CO2: 23 mmol/L (ref 22–32)
Calcium: 9.2 mg/dL (ref 8.9–10.3)
Chloride: 107 mmol/L (ref 98–111)
Creatinine, Ser: 1.08 mg/dL (ref 0.61–1.24)
GFR, Estimated: >60 mL/min (ref >60)
Glucose, Bld: 103 mg/dL — ABNORMAL HIGH (ref 70–99)
Potassium: 4.1 mmol/L (ref 3.5–5.1)
Sodium: 138 mmol/L (ref 135–145)
Total Bilirubin: 0.9 mg/dL (ref 0.3–1.2)
Total Protein: 6.9 g/dL (ref 6.5–8.1)

## 2021-07-14 LAB — CBC WITH DIFFERENTIAL/PLATELET
Abs Immature Granulocytes: 0.02 10*3/uL (ref 0.00–0.07)
Basophils Absolute: 0 10*3/uL (ref 0.0–0.1)
Basophils Relative: 0 %
Eosinophils Absolute: 0.1 10*3/uL (ref 0.0–0.5)
Eosinophils Relative: 2 %
HCT: 43.7 % (ref 39.0–52.0)
Hemoglobin: 14.8 g/dL (ref 13.0–17.0)
Immature Granulocytes: 0 %
Lymphocytes Relative: 42 %
Lymphs Abs: 2.8 10*3/uL (ref 0.7–4.0)
MCH: 32.2 pg (ref 26.0–34.0)
MCHC: 33.9 g/dL (ref 30.0–36.0)
MCV: 95 fL (ref 80.0–100.0)
Monocytes Absolute: 0.5 10*3/uL (ref 0.1–1.0)
Monocytes Relative: 7 %
Neutro Abs: 3.3 10*3/uL (ref 1.7–7.7)
Neutrophils Relative %: 49 %
Platelets: 203 10*3/uL (ref 150–400)
RBC: 4.6 MIL/uL (ref 4.22–5.81)
RDW: 12.4 % (ref 11.5–15.5)
WBC: 6.8 10*3/uL (ref 4.0–10.5)
nRBC: 0 % (ref 0.0–0.2)

## 2021-07-14 LAB — PROTIME-INR
INR: 1 (ref 0.8–1.2)
Prothrombin Time: 13.1 seconds (ref 11.4–15.2)

## 2021-07-14 LAB — POC OCCULT BLOOD, ED: Fecal Occult Bld: POSITIVE — AB

## 2021-07-14 NOTE — ED Notes (Signed)
Provider aware of positive occt stool

## 2021-07-14 NOTE — ED Provider Notes (Signed)
[facility] EMERGENCY DEPARTMENT Provider Note   CSN: [id number] Arrival date & time: 07/14/21  9476     History Chief Complaint  Patient presents with   Rectal Bleeding    Chase Torres is a 40 y.o. male.  HPI 40 year old male presents with rectal bleeding.  Has been ongoing for about 4 days.  Typically he is having a bowel movement that otherwise appears normal once a day but there is blood in the toilet and when he wipes.  Yesterday he went twice and states it seemed like darker blood rather than pink.  There is no rectal or abdominal pain.  He is not on blood thinners.  He denies feeling dizzy or lightheaded.  In the past he has had bleeding 1 day at a time but it has never lasted.  He was sent home with a card by his PCP but was unable to get it done.  He has never had a colonoscopy.  Past Medical History:  Diagnosis Date   Gout    Hypertension     Patient Active Problem List   Diagnosis Date Noted   Hypertension 07/14/2021   GASTROENTERITIS, ACUTE 01/17/2007   NECK AND BACK PAIN 11/17/2006   FOOT PAIN, LEFT 10/06/2006   CONTUSION, FINGER 10/06/2006   ANXIETY 08/26/2006   DISORDER, TOBACCO USE 08/26/2006   DEPRESSION 08/26/2006   ECZEMA 08/26/2006    Past Surgical History:  Procedure Laterality Date   HERNIA REPAIR         No family history on file.  Social History   Tobacco Use   Smoking status: Every Day  Substance Use Topics   Alcohol use: No   Drug use: Yes    Types: Marijuana    Home Medications Prior to Admission medications   Medication Sig Start Date End Date Taking? Authorizing Provider  allopurinol (ZYLOPRIM) 100 MG tablet Take 100 mg by mouth at bedtime.    [provider]  ALPRAZolam Prudy Feeler) 1 MG tablet Take 1 mg by mouth at bedtime as needed for anxiety.    [provider]  HYDROcodone-acetaminophen (NORCO/VICODIN) 5-325 MG per tablet Take 1 tablet by mouth at bedtime as needed for moderate pain.    [provider]  naproxen (NAPROSYN) 500 MG tablet Take 1 tablet (500 mg total) by mouth 2 (two) times daily. 05/16/14   Vanetta Mulders, MD  sertraline (ZOLOFT) 100 MG tablet Take 100 mg by mouth at bedtime.    [provider]    Allergies    Penicillins  Review of Systems   Review of Systems  Gastrointestinal:  Positive for blood in stool. Negative for abdominal pain, diarrhea and rectal pain.  Neurological:  Negative for light-headedness.  All other systems reviewed and are negative.  Physical Exam Updated Vital Signs BP 117/79   Pulse (!) 47   Temp 98.1 F (36.7 C) (Oral)   Resp 14   Ht 6' (1.829 m)   Wt 111.1 kg   SpO2 100%   BMI 33.23 kg/m   Physical Exam Vitals and nursing note reviewed. Exam conducted with a chaperone present.  Constitutional:      General: He is not in acute distress.    Appearance: He is well-developed.  HENT:     Head: Normocephalic and atraumatic.     Right Ear: External ear normal.     Left Ear: External ear normal.     Nose: Nose normal.  Eyes:     General:  Right eye: No discharge.        Left eye: No discharge.  Cardiovascular:     Rate and Rhythm: Regular rhythm. Bradycardia present.     Heart sounds: Normal heart sounds.  Pulmonary:     Effort: Pulmonary effort is normal.     Breath sounds: Normal breath sounds.  Abdominal:     General: There is no distension.     Palpations: Abdomen is soft.     Tenderness: There is no abdominal tenderness.  Genitourinary:    Rectum: No mass, tenderness, anal fissure or external hemorrhoid.     Comments: No obvious hemorrhoids, masses, tenderness.  There is perhaps some slight pink after digital rectal exam but no significant gross blood Musculoskeletal:     Cervical back: Neck supple.  Skin:    General: Skin is warm and dry.  Neurological:     Mental Status: He is alert.  Psychiatric:        Mood and Affect: Mood is not anxious.    ED Results / Procedures / Treatments    Labs (all labs ordered are listed, but only abnormal results are displayed) Labs Reviewed  COMPREHENSIVE METABOLIC PANEL - Abnormal; Notable for the following components:      Result Value   Glucose, Bld 103 (*)    All other components within normal limits  POC OCCULT BLOOD, ED - Abnormal; Notable for the following components:   Fecal Occult Bld POSITIVE (*)    All other components within normal limits  CBC WITH DIFFERENTIAL/PLATELET  PROTIME-INR    EKG None  Radiology No results found.  Procedures Procedures   Medications Ordered in ED Medications - No data to display  ED Course  I have reviewed the triage vital signs and the nursing notes.  Pertinent labs & imaging results that were available during my care of the patient were reviewed by me and considered in my medical decision making (see chart for details).    MDM Rules/Calculators/A&P                           Patient is well-appearing.  His hemoglobin is normal.  His other lab work is unremarkable.  No obvious cause on exam.  I think he will need an outpatient GI work-up.  Given his hemoglobin is normal after 4 days of symptoms I do not think he needs to be admitted for this though we did discuss return precautions.  He is not on blood thinners. Final Clinical Impression(s) / ED Diagnoses Final diagnoses:  Rectal bleeding    Rx / DC Orders ED Discharge Orders     None        Pricilla Loveless, MD 07/14/21 1018

## 2021-07-14 NOTE — ED Triage Notes (Signed)
Pt arrived complaining of bright red and pink stool, x4days  Pt states this has happened before but not usually 4 days straight  Pt states that he had Hemorid 3 months ago but he was having pain then and is not having pain now  Pt denies recital pain

## 2021-07-14 NOTE — Discharge Instructions (Signed)
If you develop new or worsening rectal bleeding, dizziness, lightheadedness, shortness of breath, abdominal pain, or any other new/concerning symptoms then return to the ER for evaluation.

## 2021-07-15 ENCOUNTER — Other Ambulatory Visit: Payer: Self-pay

## 2021-07-15 ENCOUNTER — Emergency Department (HOSPITAL_COMMUNITY)
Admission: EM | Admit: 2021-07-15 | Discharge: 2021-07-15 | Disposition: A | Discharge status: Home / Self Care | Payer: Self-pay | Attending: Emergency Medicine | Admitting: Emergency Medicine

## 2021-07-15 ENCOUNTER — Encounter (HOSPITAL_COMMUNITY): Payer: Self-pay | Admitting: *Deleted

## 2021-07-15 ENCOUNTER — Emergency Department (HOSPITAL_COMMUNITY): Payer: Self-pay

## 2021-07-15 DIAGNOSIS — R079 Chest pain, unspecified: Secondary | ICD-10-CM | POA: Insufficient documentation

## 2021-07-15 DIAGNOSIS — I10 Essential (primary) hypertension: Secondary | ICD-10-CM | POA: Insufficient documentation

## 2021-07-15 DIAGNOSIS — R5383 Other fatigue: Secondary | ICD-10-CM | POA: Insufficient documentation

## 2021-07-15 DIAGNOSIS — R001 Bradycardia, unspecified: Secondary | ICD-10-CM | POA: Diagnosis not present

## 2021-07-15 DIAGNOSIS — F17200 Nicotine dependence, unspecified, uncomplicated: Secondary | ICD-10-CM | POA: Insufficient documentation

## 2021-07-15 LAB — CBC
HCT: 42.6 % (ref 39.0–52.0)
Hemoglobin: 15.1 g/dL (ref 13.0–17.0)
MCH: 33.7 pg (ref 26.0–34.0)
MCHC: 35.4 g/dL (ref 30.0–36.0)
MCV: 95.1 fL (ref 80.0–100.0)
Platelets: 230 10*3/uL (ref 150–400)
RBC: 4.48 MIL/uL (ref 4.22–5.81)
RDW: 12.3 % (ref 11.5–15.5)
WBC: 7.7 10*3/uL (ref 4.0–10.5)
nRBC: 0 % (ref 0.0–0.2)

## 2021-07-15 LAB — BASIC METABOLIC PANEL
Anion gap: 9 (ref 5–15)
BUN: 12 mg/dL (ref 6–20)
CO2: 23 mmol/L (ref 22–32)
Calcium: 9.3 mg/dL (ref 8.9–10.3)
Chloride: 105 mmol/L (ref 98–111)
Creatinine, Ser: 1.09 mg/dL (ref 0.61–1.24)
GFR, Estimated: >60 mL/min (ref >60)
Glucose, Bld: 143 mg/dL — ABNORMAL HIGH (ref 70–99)
Potassium: 3.4 mmol/L — ABNORMAL LOW (ref 3.5–5.1)
Sodium: 137 mmol/L (ref 135–145)

## 2021-07-15 LAB — TROPONIN I (HIGH SENSITIVITY)
Troponin I (High Sensitivity): <2 ng/L (ref <18)
Troponin I (High Sensitivity): <2 ng/L (ref <18)

## 2021-07-15 LAB — TSH: TSH: 2.404 u[IU]/mL (ref 0.350–4.500)

## 2021-07-15 NOTE — ED Triage Notes (Signed)
Pt was seen here yesterday for rectal bleed and went to follow up with his PCP today and was found to have a heart rate in the 40's and was told to come to ED; pt c/o slight chest pressure

## 2021-07-15 NOTE — ED Provider Notes (Signed)
[facility] EMERGENCY DEPARTMENT Provider Note   CSN: [id number] Arrival date & time: 07/15/21  1216     History Chief Complaint  Patient presents with   Chest Pain    Chase Torres is a 40 y.o. male with a history including hypertension, anxiety, was seen here yesterday for a 4-day history of GI bleeding, but was felt stable for discharge home.  Seen by his PCP this morning with the intent of arranging for outpatient GI follow-up when his pulse was noted to be low 40 range.  He was sent here for further testing.  He does report having occasional episodes of this mid chest pressure lasting 5 to 10 minutes, last episode of this occurred when he woke this morning around 7 AM.  He states he has this frequently, not usually exertional.  This morning's episode occurred right after getting out of bed.  He was not dizzy or lightheaded nor was he short of breath.  He does endorse a rare occasion of feeling an abnormal heart beat lasting a second and then resolving.  He cannot recall the last time he felt the sensation.  He does endorse increasing generalized fatigue, feeling cold while others are comfortable, appetite has been good, weight has been stable.  No known family or personal history of thyroid problems.  He denies lightheadedness when standing.  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  The history is provided by the patient.      Past Medical History:  Diagnosis Date   Gout    Hypertension     Patient Active Problem List   Diagnosis Date Noted   Hypertension 07/14/2021   GASTROENTERITIS, ACUTE 01/17/2007   NECK AND BACK PAIN 11/17/2006   FOOT PAIN, LEFT 10/06/2006   CONTUSION, FINGER 10/06/2006   ANXIETY 08/26/2006   DISORDER, TOBACCO USE 08/26/2006   DEPRESSION 08/26/2006   ECZEMA 08/26/2006    Past Surgical History:  Procedure Laterality Date   HERNIA REPAIR         History reviewed.  No pertinent family history.  Social History   Tobacco Use   Smoking status: Every Day  Substance Use Topics   Alcohol use: No   Drug use: Yes    Types: Marijuana    Home Medications Prior to Admission medications   Medication Sig Start Date End Date Taking? Authorizing Provider  allopurinol (ZYLOPRIM) 100 MG tablet Take 100 mg by mouth at bedtime.    [provider]  ALPRAZolam Prudy Feeler) 1 MG tablet Take 1 mg by mouth at bedtime as needed for anxiety.    [provider]  HYDROcodone-acetaminophen (NORCO/VICODIN) 5-325 MG per tablet Take 1 tablet by mouth at bedtime as needed for moderate pain.    [provider]  naproxen (NAPROSYN) 500 MG tablet Take 1 tablet (500 mg total) by mouth 2 (two) times daily. 05/16/14   Vanetta Mulders, MD  sertraline (ZOLOFT) 100 MG tablet Take 100 mg by mouth at bedtime.    [provider]    Allergies    Penicillins  Review of Systems   Review of Systems  Constitutional:  Positive for fatigue. Negative for appetite change, chills, fever and unexpected weight change.  HENT: Negative.    Eyes: Negative.   Respiratory:  Positive for chest tightness. Negative for shortness of breath.   Cardiovascular:  Negative for chest pain.  Gastrointestinal:  Negative for abdominal pain, nausea and  vomiting.  Genitourinary: Negative.   Musculoskeletal:  Negative for arthralgias, joint swelling and neck pain.  Skin: Negative.  Negative for rash and wound.  Neurological:  Negative for dizziness, weakness, light-headedness, numbness and headaches.  Psychiatric/Behavioral: Negative.     Physical Exam Updated Vital Signs BP (!) 126/95   Pulse (!) 43   Temp 97.8 F (36.6 C) (Oral)   Resp 19   Ht 6' (1.829 m)   Wt 107.5 kg   SpO2 99%   BMI 32.14 kg/m   Physical Exam Vitals and nursing note reviewed.  Constitutional:      Appearance: He is well-developed.  HENT:     Head: Normocephalic and atraumatic.  Eyes:      Conjunctiva/sclera: Conjunctivae normal.  Cardiovascular:     Rate and Rhythm: Regular rhythm. Bradycardia present.     Heart sounds: Normal heart sounds. No murmur heard. Pulmonary:     Effort: Pulmonary effort is normal.     Breath sounds: Normal breath sounds. No wheezing.  Abdominal:     General: Bowel sounds are normal.     Palpations: Abdomen is soft.     Tenderness: There is no abdominal tenderness.  Musculoskeletal:        General: Normal range of motion.     Cervical back: Normal range of motion.  Skin:    General: Skin is warm and dry.  Neurological:     General: No focal deficit present.     Mental Status: He is alert.    ED Results / Procedures / Treatments   Labs (all labs ordered are listed, but only abnormal results are displayed) Labs Reviewed  BASIC METABOLIC PANEL - Abnormal; Notable for the following components:      Result Value   Potassium 3.4 (*)    Glucose, Bld 143 (*)    All other components within normal limits  CBC  TSH  TROPONIN I (HIGH SENSITIVITY)  TROPONIN I (HIGH SENSITIVITY)    EKG EKG Interpretation  Date/Time:  Wednesday July 15 2021 12:23:46 EST Ventricular Rate:  70 PR Interval:  188 QRS Duration: 96 QT Interval:  386 QTC Calculation: 416 R Axis:   10 Text Interpretation: Normal sinus rhythm Normal ECG Confirmed by Norman Clay ([id number]) on 07/15/2021 3:35:21 PM  Radiology DG Chest 2 View  Result Date: 07/15/2021 CLINICAL DATA:  Chest pain EXAM: CHEST - 2 VIEW COMPARISON:  None. FINDINGS: The heart size and mediastinal contours are within normal limits. Both lungs are clear. The visualized skeletal structures are unremarkable. IMPRESSION: No active cardiopulmonary disease. Electronically Signed   By: Larose Hires D.O.   On: 07/15/2021 12:45    Procedures Procedures   Medications Ordered in ED Medications - No data to display  ED Course  I have reviewed the triage vital signs and the nursing notes.  Pertinent labs &  imaging results that were available during my care of the patient were reviewed by me and considered in my medical decision making (see chart for details).    MDM Rules/Calculators/A&P                           Patient with what appears to be a chronic bradycardia as review of his records reveal occasional recorded low pulse rates dating back to 2015.  His EKG is reassuring and normal sinus rhythm without bradycardia.  He was kept on the monitor during today's work-up and his heart rate ranged from 43-67 but  remained asymptomatic.  He would benefit from cardiology evaluation and was referred for close outpatient follow-up.  His delta troponins are negative today, chest x-ray is clear, no evidence for ACS.  There were no episodes of ectopy during his monitored stay.  Discussed with Dr. Audley Hose regarding today's work-up and follow-up care. Final Clinical Impression(s) / ED Diagnoses Final diagnoses:  Bradycardia    Rx / DC Orders ED Discharge Orders          Ordered    Ambulatory referral to Cardiology        07/15/21 1557             Victoriano Lain 07/15/21 1605    Cheryll Cockayne, MD 07/17/21 1625

## 2021-07-15 NOTE — Discharge Instructions (Signed)
Your lab tests, ekg and exam are reassuring today but you would benefit from seeing a cardiologist and have been referred to our local cardiology group for this.  In the interim,  still proceed with your plans to see the GI specialists per your visit yesterday.

## 2021-07-23 ENCOUNTER — Ambulatory Visit (INDEPENDENT_AMBULATORY_CARE_PROVIDER_SITE_OTHER): Payer: Self-pay | Admitting: Cardiology

## 2021-07-23 ENCOUNTER — Encounter: Payer: Self-pay | Admitting: Cardiology

## 2021-07-23 ENCOUNTER — Other Ambulatory Visit: Payer: Self-pay

## 2021-07-23 DIAGNOSIS — R001 Bradycardia, unspecified: Secondary | ICD-10-CM | POA: Insufficient documentation

## 2021-07-23 DIAGNOSIS — Z720 Tobacco use: Secondary | ICD-10-CM

## 2021-07-23 DIAGNOSIS — R079 Chest pain, unspecified: Secondary | ICD-10-CM

## 2021-07-23 DIAGNOSIS — I10 Essential (primary) hypertension: Secondary | ICD-10-CM

## 2021-07-23 DIAGNOSIS — R002 Palpitations: Secondary | ICD-10-CM | POA: Insufficient documentation

## 2021-07-23 MED ORDER — METOPROLOL TARTRATE 100 MG PO TABS: ORAL_TABLET | ORAL | 0 refills | Status: AC

## 2021-07-23 NOTE — Assessment & Plan Note (Signed)
Continue to encourage tobacco cessation.  He states that he is down from 2 packs a day to 1 pack a day.

## 2021-07-23 NOTE — Assessment & Plan Note (Signed)
Longstanding sinus bradycardia noted in the 40s to 60s.  Should be benign.  No associated syncope, no dizziness.  Does not require pacemaker.  No AV nodal blocking agents.

## 2021-07-23 NOTE — Progress Notes (Signed)
Cardiology Office Note:    Date:  07/23/2021   ID:  Chase Torres, DOB [birth year], MRN [id number]  PCP:  Elmer Picker, [facility] Providers Cardiologist:  None     Referring MD: Burgess Amor, PA-C    History of Present Illness:    Chase Torres is a 40 y.o. male here for the evaluation of chest pain at the request of Burgess Amor, Chase Torres.  Was recently seen in [facility] emergency department with history including hypertension anxiety and 4-day history of GI bleeding stable for discharge (hemoglobin 15.1).  While he was seeing his PCP for this, his pulse was noted to be in the 40 range and he was sent back to the emergency department for further testing.  He reported having occasional episodes of mid central chest pain lasting 5 to 10 minutes.  An episode occurred when he woke up at around 7 AM.  Usually this discomfort is not exertional but it does occur several times.  No dizziness no lightheadedness no shortness of breath.  Sometimes he will feel an occasional palpitation.  No syncope, no orthopnea.  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 Some days sick feeling. Nap then OK  The ER was able to review several different records and low pulse rates were recorded back to 2015.  His heart rate during the ER encounter ranged from 43-67 but he was asymptomatic.  Troponins were negative.  Chest x-ray personally reviewed was unremarkable.  Electrolytes were unremarkable except for mildly reduced potassium at 3.4.  TSH 2.4  Past Medical History:  Diagnosis Date   Gout    Hypertension     Past Surgical History:  Procedure Laterality Date   HERNIA REPAIR      Current Medications: Current Meds  Medication Sig   allopurinol (ZYLOPRIM) 100 MG tablet Take 100 mg by mouth at bedtime.   lisinopril (ZESTRIL) 10 MG tablet 1 tablet for blood pressure   metoprolol tartrate (LOPRESSOR) 100 MG tablet Take 1 tablet two (2) hours prior to CT Scan      Allergies:   Penicillins   Social History   Socioeconomic History   Marital status: Divorced    Spouse name: Not on file   Number of children: Not on file   Years of education: Not on file   Highest education level: Not on file  Occupational History   Not on file  Tobacco Use   Smoking status: Every Day   Smokeless tobacco: Not on file  Vaping Use   Vaping Use: Never used  Substance and Sexual Activity   Alcohol use: No   Drug use: Yes    Types: Marijuana   Sexual activity: Not on file  Other Topics Concern   Not on file  Social History Narrative   Not on file   Social Determinants of Health   Financial Resource Strain: Not on file  Food Insecurity: Not on file  Transportation Needs: Not on file  Physical Activity: Not on file  Stress: Not on file  Social Connections: Not on file     Family History: The patient's family history includes Cancer in his father. Family History:    Reviewed history from 10/06/2006 and no changes required:       Father: 6 Throat Cancer       Mother: 60 Asthma, DM, Hypothyroidism, HTN       Siblings: 2 sisters 93 28 HTN, DM, Asthma, Thyroid problems and  1 brother 81 W&L except for HA and ear infections   ROS:   Please see the history of present illness.    No fevers chills nausea vomiting syncope bleeding all other systems reviewed and are negative.  EKGs/Labs/Other Studies Reviewed:    The following studies were reviewed today: ER notes  EKG:   EKG from 07/15/2021 shows sinus rhythm 71 with no other abnormalities. Recent Labs: 07/14/2021: ALT 20 07/15/2021: BUN 12; Creatinine, Ser 1.09; Hemoglobin 15.1; Platelets 230; Potassium 3.4; Sodium 137; TSH 2.404  Recent Lipid Panel No results found for: CHOL, TRIG, HDL, CHOLHDL, VLDL, LDLCALC, LDLDIRECT   Risk Assessment/Calculations:              Physical Exam:    VS:  BP 128/84    Pulse 84    Ht 6' (1.829 m)    Wt 231 lb (104.8 kg)    SpO2 96%    BMI 31.33 kg/m      Wt Readings from Last 3 Encounters:  07/23/21 231 lb (104.8 kg)  07/15/21 237 lb (107.5 kg)  07/14/21 245 lb (111.1 kg)     GEN:  Well nourished, well developed in no acute distress HEENT: Normal NECK: No JVD; No carotid bruits LYMPHATICS: No lymphadenopathy CARDIAC: RRR, no murmurs, no rubs, gallops RESPIRATORY:  Clear to auscultation without rales, wheezing or rhonchi  ABDOMEN: Soft, non-tender, non-distended MUSCULOSKELETAL:  No edema; No deformity  SKIN: Warm and dry NEUROLOGIC:  Alert and oriented x 3 PSYCHIATRIC:  Normal affect   ASSESSMENT:    1. Chest pain of uncertain etiology   2. Tobacco use   3. Primary hypertension   4. Bradycardia, sinus   5. Palpitations   6. Chest pain, unspecified type    PLAN:    In order of problems listed above:  Chest pain of uncertain etiology We will go ahead and check a coronary CT scan with possible FFR analysis to make sure that he does not have any evidence of flow-limiting coronary artery disease.  Tobacco use.  Chest pain.  Tobacco use Continue to encourage tobacco cessation.  He states that he is down from 2 packs a day to 1 pack a day.  Hypertension Currently on lisinopril.  No changes made.  Blood pressure under reasonable control.  Bradycardia, sinus Longstanding sinus bradycardia noted in the 40s to 60s.  Should be benign.  No associated syncope, no dizziness.  Does not require pacemaker.  No AV nodal blocking agents.  Palpitations Occasional skipped felt.  Likely PVC or PAC.  No high risk symptoms.  If symptoms worsen or become more worrisome, could always consider adding a ZIO monitor to his work-up.  Watch caffeine.       Medication Adjustments/Labs and Tests Ordered: Current medicines are reviewed at length with the patient today.  Concerns regarding medicines are outlined above.  Orders Placed This Encounter  Procedures   CT CORONARY MORPH W/CTA COR W/SCORE W/CA W/CM &/OR WO/CM   ECHOCARDIOGRAM COMPLETE     Meds ordered this encounter  Medications   metoprolol tartrate (LOPRESSOR) 100 MG tablet    Sig: Take 1 tablet two (2) hours prior to CT Scan    Dispense:  1 tablet    Refill:  0     Patient Instructions  Medication Instructions:  Your physician recommends that you continue on your current medications as directed. Please refer to the Current Medication list given to you today.  *If you need a refill on your cardiac medications  before your next appointment, please call your pharmacy*   Lab Work: None If you have labs (blood work) drawn today and your tests are completely normal, you will receive your results only by: MyChart Message (if you have MyChart) OR A paper copy in the mail If you have any lab test that is abnormal or we need to change your treatment, we will call you to review the results.   Testing/Procedures: Your physician has requested that you have an echocardiogram. Echocardiography is a painless test that uses sound waves to create images of your heart. It provides your doctor with information about the size and shape of your heart and how well your hearts chambers and valves are working. This procedure takes approximately one hour. There are no restrictions for this procedure.    Follow-Up: At [facility], you and your health needs are our priority.  As part of our continuing mission to provide you with exceptional heart care, we have created designated Provider Care Teams.  These Care Teams include your primary Cardiologist (physician) and Advanced Practice Providers (APPs -  Physician Assistants and Nurse Practitioners) who all work together to provide you with the care you need, when you need it.  We recommend signing up for the patient portal called "MyChart".  Sign up information is provided on this After Visit Summary.  MyChart is used to connect with patients for Virtual Visits (Telemedicine).  Patients are able to view lab/test results, encounter  notes, upcoming appointments, etc.  Non-urgent messages can be sent to your provider as well.   To learn more about what you can do with MyChart, go to [email or web address].    Your next appointment:   Follow Up: As Needed   Other Instructions   Your cardiac CT will be scheduled at one of the below locations:   [facility] [address] [place], [place] [address] [phone]  OR  [facility] [address] [place], [place] [address] [phone]  If scheduled at [facility], please arrive at the [facility] main entrance (entrance A) of [facility] 30 minutes prior to test start time. You can use the FREE valet parking offered at the main entrance (encouraged to control the heart rate for the test) Proceed to the [facility] Radiology Department (first floor) to check-in and test prep.  If scheduled at [facility], please arrive 15 mins early for check-in and test prep.  Please follow these instructions carefully (unless otherwise directed):  Hold all erectile dysfunction medications at least 3 days (72 hrs) prior to test. TAKE METOPROLOL TARTRATE 100 MG TABLET TWO (2) HOURS PRIOR TO CT SCAN   On the Night Before the Test: Be sure to Drink plenty of water. Do not consume any caffeinated/decaffeinated beverages or chocolate 12 hours prior to your test. Do not take any antihistamines 12 hours prior to your test.  On the Day of the Test: Drink plenty of water until 1 hour prior to the test. Do not eat any food 4 hours prior to the test. You may take your regular medications prior to the test.  Take metoprolol (Lopressor) two hours prior to test. HOLD Furosemide/Hydrochlorothiazide morning of the test.  After the Test: Drink plenty of water. After receiving IV contrast, you may experience a mild flushed feeling. This is normal. On occasion, you may experience a mild  rash up to 24 hours after the test. This is not dangerous. If this  occurs, you can take Benadryl 25 mg and increase your fluid intake. If you experience trouble breathing, this can be serious. If it is severe call 911 IMMEDIATELY. If it is mild, please call our office. If you take any of these medications: Glipizide/Metformin, Avandament, Glucavance, please do not take 48 hours after completing test unless otherwise instructed.  Please allow 2-4 weeks for scheduling of routine cardiac CTs. Some insurance companies require a pre-authorization which may delay scheduling of this test.   For non-scheduling related questions, please contact the cardiac imaging nurse navigator should you have any questions/concerns: Rockwell Alexandria, Cardiac Imaging Nurse Navigator Larey Brick, Cardiac Imaging Nurse Navigator [facility] Direct Office Dial: [phone]   For scheduling needs, including cancellations and rescheduling, please call [place], [phone].    Signed, Donato Schultz, MD  07/23/2021 4:31 PM    [facility]

## 2021-07-23 NOTE — Patient Instructions (Addendum)
Medication Instructions:  Your physician recommends that you continue on your current medications as directed. Please refer to the Current Medication list given to you today.  *If you need a refill on your cardiac medications before your next appointment, please call your pharmacy*   Lab Work: None If you have labs (blood work) drawn today and your tests are completely normal, you will receive your results only by: MyChart Message (if you have MyChart) OR A paper copy in the mail If you have any lab test that is abnormal or we need to change your treatment, we will call you to review the results.   Testing/Procedures: Your physician has requested that you have an echocardiogram. Echocardiography is a painless test that uses sound waves to create images of your heart. It provides your doctor with information about the size and shape of your heart and how well your hearts chambers and valves are working. This procedure takes approximately one hour. There are no restrictions for this procedure.    Follow-Up: At [facility], you and your health needs are our priority.  As part of our continuing mission to provide you with exceptional heart care, we have created designated Provider Care Teams.  These Care Teams include your primary Cardiologist (physician) and Advanced Practice Providers (APPs -  Physician Assistants and Nurse Practitioners) who all work together to provide you with the care you need, when you need it.  We recommend signing up for the patient portal called "MyChart".  Sign up information is provided on this After Visit Summary.  MyChart is used to connect with patients for Virtual Visits (Telemedicine).  Patients are able to view lab/test results, encounter notes, upcoming appointments, etc.  Non-urgent messages can be sent to your provider as well.   To learn more about what you can do with MyChart, go to [email or web address].    Your next appointment:   Follow Up: As  Needed   Other Instructions   Your cardiac CT will be scheduled at one of the below locations:   [facility] [address] [place], [place] [address] [phone]  OR  [facility] [address] [place], [place] [address] [phone]  If scheduled at [facility], please arrive at the [facility] main entrance (entrance A) of [facility] 30 minutes prior to test start time. You can use the FREE valet parking offered at the main entrance (encouraged to control the heart rate for the test) Proceed to the [facility] Radiology Department (first floor) to check-in and test prep.  If scheduled at [facility], please arrive 15 mins early for check-in and test prep.  Please follow these instructions carefully (unless otherwise directed):  Hold all erectile dysfunction medications at least 3 days (72 hrs) prior to test. TAKE METOPROLOL TARTRATE 100 MG TABLET TWO (2) HOURS PRIOR TO CT SCAN   On the Night Before the Test: Be sure to Drink plenty of water. Do not consume any caffeinated/decaffeinated beverages or chocolate 12 hours prior to your test. Do not take any antihistamines 12 hours prior to your test.  On the Day of the Test: Drink plenty of water until 1 hour prior to the test. Do not eat any food 4 hours prior to the test. You may take your regular medications prior to the test.  Take metoprolol (Lopressor) two hours prior to test. HOLD Furosemide/Hydrochlorothiazide morning of the test.  After the Test: Drink plenty of water.  After receiving IV contrast, you may experience a mild flushed feeling. This is normal. On occasion, you may experience a mild rash up to 24 hours after the test. This is not dangerous. If this occurs, you can take Benadryl 25 mg and increase your fluid intake. If you experience trouble breathing, this can be serious. If it is severe call 911  IMMEDIATELY. If it is mild, please call our office. If you take any of these medications: Glipizide/Metformin, Avandament, Glucavance, please do not take 48 hours after completing test unless otherwise instructed.  Please allow 2-4 weeks for scheduling of routine cardiac CTs. Some insurance companies require a pre-authorization which may delay scheduling of this test.   For non-scheduling related questions, please contact the cardiac imaging nurse navigator should you have any questions/concerns: Rockwell Alexandria, Cardiac Imaging Nurse Navigator Larey Brick, Cardiac Imaging Nurse Navigator [facility] Direct Office Dial: [phone]   For scheduling needs, including cancellations and rescheduling, please call [place], [phone].

## 2021-07-23 NOTE — Assessment & Plan Note (Signed)
Occasional skipped felt.  Likely PVC or PAC.  No high risk symptoms.  If symptoms worsen or become more worrisome, could always consider adding a ZIO monitor to his work-up.  Watch caffeine.

## 2021-07-23 NOTE — Assessment & Plan Note (Signed)
We will go ahead and check a coronary CT scan with possible FFR analysis to make sure that he does not have any evidence of flow-limiting coronary artery disease.  Tobacco use.  Chest pain.

## 2021-07-23 NOTE — Assessment & Plan Note (Signed)
Currently on lisinopril.  No changes made.  Blood pressure under reasonable control.

## 2021-08-11 ENCOUNTER — Ambulatory Visit (HOSPITAL_COMMUNITY): Admission: RE | Admit: 2021-08-11 | Payer: Self-pay | Source: Ambulatory Visit

## 2021-08-28 ENCOUNTER — Encounter (HOSPITAL_COMMUNITY): Payer: Self-pay

## 2021-09-02 ENCOUNTER — Ambulatory Visit (HOSPITAL_COMMUNITY): Payer: Self-pay

## 2022-04-23 ENCOUNTER — Other Ambulatory Visit: Payer: Self-pay

## 2022-04-23 DIAGNOSIS — R202 Paresthesia of skin: Secondary | ICD-10-CM

## 2022-04-27 ENCOUNTER — Ambulatory Visit (INDEPENDENT_AMBULATORY_CARE_PROVIDER_SITE_OTHER): Payer: Self-pay | Admitting: Neurology

## 2022-04-27 DIAGNOSIS — G5603 Carpal tunnel syndrome, bilateral upper limbs: Secondary | ICD-10-CM

## 2022-04-27 DIAGNOSIS — G5623 Lesion of ulnar nerve, bilateral upper limbs: Secondary | ICD-10-CM

## 2022-04-27 DIAGNOSIS — R202 Paresthesia of skin: Secondary | ICD-10-CM

## 2022-04-27 NOTE — Procedures (Signed)
[facility]  [place], [place]  [place], [place] [address] Tel: [phone] Fax:  [phone] Test Date:  04/27/2022  Patient: Chase Torres DOB: January 20, 1981 Physician: Narda Amber, DO  Sex: Male Height: 6' " Ref Phys: Judithann Sauger, DO  ID#: [id number]   Technician:    Patient Complaints: This is a 41 year old man referred for evaluation of bilateral hand paresthesias and pain.  NCV & EMG Findings: Extensive electrodiagnostic testing of the right upper extremity and additional studies of the left shows:  Bilateral median sensory responses show prolonged latency (R5.0, L7.1 ms) and reduced amplitude (R8.2, L7.1 V).  Bilateral ulnar sensory responses are within normal limits. Bilateral median motor responses show prolonged latency (R5.6, L7.0 ms).  Bilateral ulnar motor responses show slowed conduction velocity across the elbow (A Elbow-B Elbow, 40 m/s), motor amplitude reduced on the left side only (L6.5 mV). Chronic motor axonal loss changes are seen affecting the abductor pollicis brevis and abductor digiti minimi muscles.  Fibrillation potentials are isolated to bilateral abductor pollicis brevis muscles.  Impression: Bilateral median neuropathy at or distal to the wrist, consistent with a clinical diagnosis of carpal tunnel syndrome.  Overall, these findings are moderate-to-severe and worse on the left. Bilateral ulnar neuropathy with slowing across the elbow, predominantly demyelinating, mild-moderate.   ___________________________ Narda Amber, DO    Nerve Conduction Studies Anti Sensory Summary Table   Stim Site NR Peak (ms) Norm Peak (ms) O-P Amp (V) Norm O-P Amp  Left Median Anti Sensory (2nd Digit)  33C  Wrist    7.1 <3.4 7.1 >20  Right Median Anti Sensory (2nd Digit)  33C  Wrist    5.0 <3.4 8.2 >20  Left Ulnar Anti Sensory (5th Digit)  33C  Wrist    2.9 <3.1 12.7 >12  Right Ulnar Anti Sensory (5th Digit)  33C  Wrist    3.0 <3.1 13.9 >12    Motor Summary Table   Stim Site NR Onset (ms) Norm Onset (ms) O-P Amp (mV) Norm O-P Amp Site1 Site2 Delta-0 (ms) Dist (cm) Vel (m/s) Norm Vel (m/s)  Left Median Motor (Abd Poll Brev)  33C  Wrist    7.1 <3.9 7.2 >6 Elbow Wrist 5.8 30.0 52 >50  Elbow    12.9  6.5         Right Median Motor (Abd Poll Brev)  33C  Wrist    5.6 <3.9 8.0 >6 Elbow Wrist 5.8 30.0 52 >50  Elbow    11.4  7.4         Left Ulnar Motor (Abd Dig Minimi)  33C  Wrist    1.9 <3.1 6.5 >7 B Elbow Wrist 4.6 23.0 50 >50  B Elbow    6.5  5.6  A Elbow B Elbow 2.5 10.0 40 >50  A Elbow    9.0  5.6         Right Ulnar Motor (Abd Dig Minimi)  33C  Wrist    2.8 <3.1 7.5 >7 B Elbow Wrist 4.1 24.0 59 >50  B Elbow    6.9  7.2  A Elbow B Elbow 2.7 10.0 37 >50  A Elbow    9.6  6.7          EMG   Side Muscle Ins Act Fibs Fasc Recrt Dur. Amp. Poly. Activation Comment  Right 1stDorInt Nml Nml Nml Nml Nml Nml Nml Nml N/A  Right Abd Poll Brev Nml 1+ Nml 1- 1+ 1+ 1+ Nml N/A  Right ABD Dig Min Nml Nml Nml 1- 1+ 1+ 1+ Nml N/A  Right FlexCarpiUln Nml Nml Nml Nml Nml Nml Nml Nml N/A  Right PronatorTeres Nml Nml Nml Nml Nml Nml Nml Nml N/A  Right Biceps Nml Nml Nml Nml Nml Nml Nml Nml N/A  Right Triceps Nml Nml Nml Nml Nml Nml Nml Nml N/A  Right Deltoid Nml Nml Nml Nml Nml Nml Nml Nml N/A  Left 1stDorInt Nml Nml Nml Nml Nml Nml Nml Nml N/A  Left Abd Poll Brev Nml 1+ Nml 1- 1+ 1+ 1+ Nml N/A  Left PronatorTeres Nml Nml Nml Nml Nml Nml Nml Nml N/A  Left Biceps Nml Nml Nml Nml Nml Nml Nml Nml N/A  Left Triceps Nml Nml Nml Nml Nml Nml Nml Nml N/A  Left Deltoid Nml Nml Nml Nml Nml Nml Nml Nml N/A  Left ABD Dig Min Nml Nml Nml 1- 1+ 1+ 1+ Nml N/A  Left FlexCarpiUln Nml Nml Nml Nml Nml Nml Nml Nml N/A      Waveforms:

## 2022-05-04 ENCOUNTER — Ambulatory Visit: Payer: Self-pay | Admitting: Dermatology

## 2022-08-03 ENCOUNTER — Emergency Department (HOSPITAL_COMMUNITY)
Admission: EM | Admit: 2022-08-03 | Discharge: 2022-08-03 | Disposition: A | Discharge status: Home / Self Care | Payer: Medicaid Other | Attending: Emergency Medicine | Admitting: Emergency Medicine

## 2022-08-03 ENCOUNTER — Other Ambulatory Visit: Payer: Self-pay

## 2022-08-03 DIAGNOSIS — J101 Influenza due to other identified influenza virus with other respiratory manifestations: Secondary | ICD-10-CM | POA: Diagnosis not present

## 2022-08-03 DIAGNOSIS — Z20822 Contact with and (suspected) exposure to covid-19: Secondary | ICD-10-CM | POA: Diagnosis not present

## 2022-08-03 DIAGNOSIS — R059 Cough, unspecified: Secondary | ICD-10-CM | POA: Diagnosis present

## 2022-08-03 DIAGNOSIS — Z79899 Other long term (current) drug therapy: Secondary | ICD-10-CM | POA: Diagnosis not present

## 2022-08-03 DIAGNOSIS — I10 Essential (primary) hypertension: Secondary | ICD-10-CM | POA: Diagnosis not present

## 2022-08-03 LAB — RESP PANEL BY RT-PCR (RSV, FLU A&B, COVID)  RVPGX2
Influenza A by PCR: POSITIVE — AB
Influenza B by PCR: NEGATIVE
Resp Syncytial Virus by PCR: NEGATIVE
SARS Coronavirus 2 by RT PCR: NEGATIVE

## 2022-08-03 NOTE — Discharge Instructions (Signed)
TYlenol every 4 hours.  Drink plenty of fluids

## 2022-08-03 NOTE — ED Triage Notes (Signed)
Pt states he has had bodyaches,chills/hotflashes, cough x 2 days, states "I have not slept or ate in 2 days, I just feel terrible all over" denies vomiting but states nausea. States his brothers kids are sick and has been around them recently

## 2022-08-04 NOTE — ED Provider Notes (Signed)
[facility] EMERGENCY DEPARTMENT Provider Note   CSN: [id number] Arrival date & time: 08/03/22  6073     History  No chief complaint on file.   Chase Torres is a 41 y.o. male.  Patient complains of a cough and congestion.  Patient reports he has bodyaches all over.  Patient is unsure of possible exposure to flu or COVID.  Patient has past medical history of hypertension.  The history is provided by the patient. No language interpreter was used.  Cough Cough characteristics:  Non-productive Sputum characteristics:  Nondescript Severity:  Moderate Onset quality:  Gradual Duration:  2 days Timing:  Constant Progression:  Worsening Chronicity:  New Relieved by:  Nothing Worsened by:  Nothing Ineffective treatments:  None tried      Home Medications Prior to Admission medications   Medication Sig Start Date End Date Taking? Authorizing Provider  allopurinol (ZYLOPRIM) 100 MG tablet Take 100 mg by mouth at bedtime.    [provider]  lisinopril (ZESTRIL) 10 MG tablet 1 tablet for blood pressure 12/13/19   [provider]  metoprolol tartrate (LOPRESSOR) 100 MG tablet Take 1 tablet two (2) hours prior to CT Scan 07/23/21   Jake Bathe, MD      Allergies    Penicillins    Review of Systems   Review of Systems  Respiratory:  Positive for cough.   All other systems reviewed and are negative.   Physical Exam Updated Vital Signs BP (!) 137/99   Pulse 60   Temp 99.3 F (37.4 C) (Oral)   Resp 18   Ht 6' (1.829 m)   Wt 104.8 kg   SpO2 93%   BMI 31.33 kg/m  Physical Exam Vitals and nursing note reviewed.  Constitutional:      Appearance: He is well-developed.  HENT:     Head: Normocephalic.  Cardiovascular:     Rate and Rhythm: Normal rate.  Pulmonary:     Effort: Pulmonary effort is normal.  Abdominal:     General: There is no distension.  Musculoskeletal:        General: Normal range of motion.     Cervical back: Normal range of  motion.  Skin:    General: Skin is warm.  Neurological:     General: No focal deficit present.     Mental Status: He is alert and oriented to person, place, and time.  Psychiatric:        Mood and Affect: Mood normal.     ED Results / Procedures / Treatments   Labs (all labs ordered are listed, but only abnormal results are displayed) Labs Reviewed  RESP PANEL BY RT-PCR (RSV, FLU A&B, COVID)  RVPGX2 - Abnormal; Notable for the following components:      Result Value   Influenza A by PCR POSITIVE (*)    All other components within normal limits    EKG None  Radiology No results found.  Procedures Procedures    Medications Ordered in ED Medications - No data to display  ED Course/ Medical Decision Making/ A&P                           Medical Decision Making Patient complains of cough congestion fever and bodyaches  Amount and/or Complexity of Data Reviewed Labs: ordered. Decision-making details documented in ED Course.    Details: Labs ordered reviewed and interpreted patient is positive for influenza A  Risk OTC drugs.  Risk Details: Patient is counseled on results of testing.  Patient is advised Tylenol or ibuprofen for fever and body aches           Final Clinical Impression(s) / ED Diagnoses Final diagnoses:  Influenza A    Rx / DC Orders ED Discharge Orders     None     An After Visit Summary was printed and given to the patient.     Elson Areas, Cordelia Poche 08/04/22 1730    Glyn Ade, MD 08/05/22 [phone]

## 2022-08-05 ENCOUNTER — Emergency Department (HOSPITAL_COMMUNITY)
Admission: EM | Admit: 2022-08-05 | Discharge: 2022-08-05 | Discharge status: Against Medical Advice | Payer: Medicaid Other | Attending: Emergency Medicine | Admitting: Emergency Medicine

## 2022-08-05 DIAGNOSIS — M7918 Myalgia, other site: Secondary | ICD-10-CM | POA: Diagnosis not present

## 2022-08-05 DIAGNOSIS — R109 Unspecified abdominal pain: Secondary | ICD-10-CM | POA: Diagnosis present

## 2022-08-05 DIAGNOSIS — H9201 Otalgia, right ear: Secondary | ICD-10-CM | POA: Diagnosis not present

## 2022-08-05 DIAGNOSIS — R110 Nausea: Secondary | ICD-10-CM | POA: Diagnosis not present

## 2022-08-05 DIAGNOSIS — Z5321 Procedure and treatment not carried out due to patient leaving prior to being seen by health care provider: Secondary | ICD-10-CM | POA: Insufficient documentation

## 2022-08-05 LAB — CBC
HCT: 47.6 % (ref 39.0–52.0)
Hemoglobin: 16.8 g/dL (ref 13.0–17.0)
MCH: 32.3 pg (ref 26.0–34.0)
MCHC: 35.3 g/dL (ref 30.0–36.0)
MCV: 91.5 fL (ref 80.0–100.0)
Platelets: 173 10*3/uL (ref 150–400)
RBC: 5.2 MIL/uL (ref 4.22–5.81)
RDW: 12.4 % (ref 11.5–15.5)
WBC: 8.6 10*3/uL (ref 4.0–10.5)
nRBC: 0 % (ref 0.0–0.2)

## 2022-08-05 LAB — COMPREHENSIVE METABOLIC PANEL
ALT: 24 U/L (ref 0–44)
AST: 26 U/L (ref 15–41)
Albumin: 4.4 g/dL (ref 3.5–5.0)
Alkaline Phosphatase: 59 U/L (ref 38–126)
Anion gap: 10 (ref 5–15)
BUN: 15 mg/dL (ref 6–20)
CO2: 22 mmol/L (ref 22–32)
Calcium: 9.2 mg/dL (ref 8.9–10.3)
Chloride: 105 mmol/L (ref 98–111)
Creatinine, Ser: 1.1 mg/dL (ref 0.61–1.24)
GFR, Estimated: >60 mL/min (ref >60)
Glucose, Bld: 103 mg/dL — ABNORMAL HIGH (ref 70–99)
Potassium: 3.9 mmol/L (ref 3.5–5.1)
Sodium: 137 mmol/L (ref 135–145)
Total Bilirubin: 0.5 mg/dL (ref 0.3–1.2)
Total Protein: 7.8 g/dL (ref 6.5–8.1)

## 2022-08-05 LAB — LIPASE, BLOOD: Lipase: 30 U/L (ref 11–51)

## 2022-08-05 NOTE — ED Triage Notes (Signed)
Per registration, pt LWBS 

## 2022-08-05 NOTE — ED Triage Notes (Signed)
Pt presents to ED from home C/O abdominal pain, nausea, body aches, R ear ache since Monday. Pt reports seen by PCP and told to come to ED for IVF.

## 2023-11-15 ENCOUNTER — Other Ambulatory Visit (HOSPITAL_COMMUNITY): Payer: Self-pay | Admitting: Family Medicine

## 2023-11-15 ENCOUNTER — Ambulatory Visit (HOSPITAL_COMMUNITY)
Admission: RE | Admit: 2023-11-15 | Discharge: 2023-11-15 | Disposition: A | Discharge status: Home / Self Care | Source: Ambulatory Visit | Attending: Family Medicine | Admitting: Family Medicine

## 2023-11-15 DIAGNOSIS — R109 Unspecified abdominal pain: Secondary | ICD-10-CM | POA: Diagnosis present

## 2023-11-15 DIAGNOSIS — R319 Hematuria, unspecified: Secondary | ICD-10-CM
